# Patient Record
Sex: Male | Born: 1970 | Race: White | Hispanic: No | Marital: Married | Smoking: Never smoker
Health system: Southern US, Community
[De-identification: ages and names within clinical notes are randomized; demographics above are authoritative.]

## PROBLEM LIST (undated history)

## (undated) DIAGNOSIS — I10 Essential (primary) hypertension: Secondary | ICD-10-CM

## (undated) HISTORY — PX: KNEE SURGERY: SHX244

## (undated) HISTORY — PX: SHOULDER SURGERY: SHX246

---

## 2017-10-01 ENCOUNTER — Emergency Department (HOSPITAL_COMMUNITY): Payer: BLUE CROSS/BLUE SHIELD

## 2017-10-01 ENCOUNTER — Other Ambulatory Visit: Payer: Self-pay

## 2017-10-01 ENCOUNTER — Emergency Department (HOSPITAL_COMMUNITY)
Admission: EM | Admit: 2017-10-01 | Discharge: 2017-10-01 | Disposition: A | Discharge status: Home / Self Care | Payer: BLUE CROSS/BLUE SHIELD | Attending: Emergency Medicine | Admitting: Emergency Medicine

## 2017-10-01 ENCOUNTER — Encounter (HOSPITAL_COMMUNITY): Payer: Self-pay

## 2017-10-01 DIAGNOSIS — N132 Hydronephrosis with renal and ureteral calculous obstruction: Secondary | ICD-10-CM | POA: Insufficient documentation

## 2017-10-01 DIAGNOSIS — I10 Essential (primary) hypertension: Secondary | ICD-10-CM | POA: Diagnosis not present

## 2017-10-01 DIAGNOSIS — R109 Unspecified abdominal pain: Secondary | ICD-10-CM | POA: Diagnosis present

## 2017-10-01 DIAGNOSIS — N200 Calculus of kidney: Secondary | ICD-10-CM

## 2017-10-01 HISTORY — DX: Essential (primary) hypertension: I10

## 2017-10-01 LAB — URINALYSIS, ROUTINE W REFLEX MICROSCOPIC
Bacteria, UA: NONE SEEN
Bilirubin Urine: NEGATIVE
Glucose, UA: NEGATIVE mg/dL
Ketones, ur: 20 mg/dL — AB
Nitrite: NEGATIVE
PH: 6 (ref 5.0–8.0)
Protein, ur: NEGATIVE mg/dL
Specific Gravity, Urine: 1.018 (ref 1.005–1.030)

## 2017-10-01 LAB — CBC WITH DIFFERENTIAL/PLATELET
BASOS ABS: 0 10*3/uL (ref 0.0–0.1)
BASOS PCT: 0 %
Eosinophils Absolute: 0 10*3/uL (ref 0.0–0.7)
Eosinophils Relative: 0 %
HCT: 43.8 % (ref 39.0–52.0)
Hemoglobin: 15.1 g/dL (ref 13.0–17.0)
LYMPHS PCT: 6 %
Lymphs Abs: 0.9 10*3/uL (ref 0.7–4.0)
MCH: 30.7 pg (ref 26.0–34.0)
MCHC: 34.5 g/dL (ref 30.0–36.0)
MCV: 89 fL (ref 78.0–100.0)
MONOS PCT: 2 %
Monocytes Absolute: 0.3 10*3/uL (ref 0.1–1.0)
NEUTROS ABS: 14.2 10*3/uL — AB (ref 1.7–7.7)
NEUTROS PCT: 92 %
PLATELETS: 264 10*3/uL (ref 150–400)
RBC: 4.92 MIL/uL (ref 4.22–5.81)
RDW: 12.8 % (ref 11.5–15.5)
WBC: 15.4 10*3/uL — ABNORMAL HIGH (ref 4.0–10.5)

## 2017-10-01 LAB — COMPREHENSIVE METABOLIC PANEL
ALBUMIN: 4.1 g/dL (ref 3.5–5.0)
ALT: 36 U/L (ref 0–44)
AST: 29 U/L (ref 15–41)
Alkaline Phosphatase: 62 U/L (ref 38–126)
Anion gap: 14 (ref 5–15)
BUN: 16 mg/dL (ref 6–20)
CHLORIDE: 104 mmol/L (ref 98–111)
CO2: 22 mmol/L (ref 22–32)
Calcium: 9.4 mg/dL (ref 8.9–10.3)
Creatinine, Ser: 1.38 mg/dL — ABNORMAL HIGH (ref 0.61–1.24)
GFR calc Af Amer: >60 mL/min (ref >60)
GFR calc non Af Amer: 60 mL/min — ABNORMAL LOW (ref >60)
GLUCOSE: 135 mg/dL — AB (ref 70–99)
POTASSIUM: 3.8 mmol/L (ref 3.5–5.1)
SODIUM: 140 mmol/L (ref 135–145)
Total Bilirubin: 0.9 mg/dL (ref 0.3–1.2)
Total Protein: 7.4 g/dL (ref 6.5–8.1)

## 2017-10-01 MED ORDER — TAMSULOSIN HCL 0.4 MG PO CAPS: 0.4000 mg | ORAL_CAPSULE | Freq: Every day | ORAL | 0 refills | Status: AC

## 2017-10-01 MED ORDER — HYDROCODONE-ACETAMINOPHEN 5-325 MG PO TABS: 2.0000 | ORAL_TABLET | Freq: Four times a day (QID) | ORAL | 0 refills | Status: AC | PRN

## 2017-10-01 MED ORDER — HYDROMORPHONE HCL 1 MG/ML IJ SOLN
1.0000 mg | Freq: Once | INTRAMUSCULAR | Status: AC
  Administered 2017-10-01: 1 mg via INTRAVENOUS
  Filled 2017-10-01: qty 1

## 2017-10-01 MED ORDER — ONDANSETRON HCL 4 MG/2ML IJ SOLN
4.0000 mg | Freq: Once | INTRAMUSCULAR | Status: AC
  Administered 2017-10-01: 4 mg via INTRAVENOUS
  Filled 2017-10-01: qty 2

## 2017-10-01 MED ORDER — MORPHINE SULFATE (PF) 4 MG/ML IV SOLN
4.0000 mg | Freq: Once | INTRAVENOUS | Status: AC
  Administered 2017-10-01: 4 mg via INTRAVENOUS
  Filled 2017-10-01: qty 1

## 2017-10-01 MED ORDER — SODIUM CHLORIDE 0.9 % IV BOLUS
1000.0000 mL | Freq: Once | INTRAVENOUS | Status: AC
  Administered 2017-10-01: 1000 mL via INTRAVENOUS

## 2017-10-01 MED ORDER — KETOROLAC TROMETHAMINE 15 MG/ML IJ SOLN
15.0000 mg | Freq: Once | INTRAMUSCULAR | Status: AC
  Administered 2017-10-01: 15 mg via INTRAVENOUS
  Filled 2017-10-01: qty 1

## 2017-10-01 NOTE — ED Triage Notes (Signed)
Pt states approx 1 hour ago, he started having left sided flank pain going to his groin. Pt states he has never had pain "this bad before".

## 2017-10-01 NOTE — ED Provider Notes (Signed)
[facility]-EMERGENCY DEPT Provider Note   CSN: [id number] Arrival date & time: 10/01/17  1506   History   Chief Complaint Chief Complaint  Patient presents with  . Flank Pain    HPI James Keith is a 47 y.o. male with a past medical history significant for hypertension who presents for evaluation of left-sided flank pain.  Per patient this pain started approximately 1 PM and was sudden in onset.  His pain radiates to his left groin.  Rates his pain a 10/10.  Denies history of kidney stones, fever, chills, dysuria, hematuria, diarrhea, constipation.     Past Medical History:  Diagnosis Date  . Hypertension     There are no active problems to display for this patient.   Past Surgical History:  Procedure Laterality Date  . KNEE SURGERY    . SHOULDER SURGERY          Home Medications    Prior to Admission medications   Medication Sig Start Date End Date Taking? Authorizing Provider  HYDROcodone-acetaminophen (NORCO/VICODIN) 5-325 MG tablet Take 2 tablets by mouth every 6 (six) hours as needed for up to 3 days. 10/01/17 10/04/17  Kae Lauman A, PA-C  tamsulosin (FLOMAX) 0.4 MG CAPS capsule Take 1 capsule (0.4 mg total) by mouth daily for 7 days. 10/01/17 10/08/17  Jamillia Closson A, PA-C    Family History No family history on file.  Social History Social History   Tobacco Use  . Smoking status: Never Smoker  . Smokeless tobacco: Never Used  Substance Use Topics  . Alcohol use: Yes    Comment: occ  . Drug use: Never     Allergies   Patient has no known allergies.   Review of Systems Review of Systems  Constitutional: Negative for activity change, appetite change, chills, diaphoresis, fatigue, fever and unexpected weight change.  Respiratory: Negative.   Cardiovascular: Negative.   Gastrointestinal: Positive for abdominal pain and nausea. Negative for abdominal distention, blood in stool, constipation, diarrhea and vomiting.    Musculoskeletal: Positive for back pain. Negative for neck pain and neck stiffness.  Skin: Negative.      Physical Exam Updated Vital Signs BP (!) 154/114 (BP Location: Left Arm)   Pulse (!) 105   Temp 98.3 F (36.8 C) (Oral)   Resp 18   Ht 6\' 5"  (1.956 m)   Wt 127 kg   SpO2 98%   BMI 33.20 kg/m   Physical Exam  Constitutional: He appears well-developed and well-nourished. No distress.  HENT:  Head: Atraumatic.  Eyes: Pupils are equal, round, and reactive to light.  Neck: Normal range of motion. Neck supple.  Cardiovascular: Normal rate, regular rhythm, normal heart sounds and intact distal pulses.  Pulmonary/Chest: Effort normal and breath sounds normal. No respiratory distress.  Abdominal: Soft. Normal appearance and bowel sounds are normal. He exhibits no distension, no fluid wave, no abdominal bruit, no ascites, no pulsatile midline mass and no mass. There is no hepatosplenomegaly. There is no tenderness. There is CVA tenderness. There is no rigidity, no rebound, no guarding, no tenderness at McBurney's point and negative Murphy's sign.  Musculoskeletal: Normal range of motion.       Left hip: Normal.       Lumbar back: Normal.  Neurological: He is alert.  Skin: Skin is warm and dry. He is not diaphoretic.  Psychiatric: He has a normal mood and affect.  Nursing note and vitals reviewed.    ED Treatments / Results  Labs (  all labs ordered are listed, but only abnormal results are displayed) Labs Reviewed  URINALYSIS, ROUTINE W REFLEX MICROSCOPIC - Abnormal; Notable for the following components:      Result Value   Hgb urine dipstick MODERATE (*)    Ketones, ur 20 (*)    Leukocytes, UA TRACE (*)    All other components within normal limits  CBC WITH DIFFERENTIAL/PLATELET - Abnormal; Notable for the following components:   WBC 15.4 (*)    Neutro Abs 14.2 (*)    All other components within normal limits  COMPREHENSIVE METABOLIC PANEL - Abnormal; Notable for the  following components:   Glucose, Bld 135 (*)    Creatinine, Ser 1.38 (*)    GFR calc non Af Amer 60 (*)    All other components within normal limits    EKG None  Radiology Ct Abdomen Pelvis Wo Contrast  Result Date: 10/01/2017 CLINICAL DATA:  Left-sided flank pain EXAM: CT ABDOMEN AND PELVIS WITHOUT CONTRAST TECHNIQUE: Multidetector CT imaging of the abdomen and pelvis was performed following the standard protocol without IV contrast. COMPARISON:  None. FINDINGS: Lower chest: No acute abnormality. Hepatobiliary: No focal liver abnormality is seen. No gallstones, gallbladder wall thickening, or biliary dilatation. Pancreas: Unremarkable. No pancreatic ductal dilatation or surrounding inflammatory changes. Spleen: Normal in size without focal abnormality. Adrenals/Urinary Tract: Normal bilateral adrenal glands. Mild to moderate left-sided hydroureteronephrosis secondary to a 5 mm calculus at the level of the left UPJ. Nonobstructing calculus in the lower pole the left kidney is also noted measuring approximately 5-6 mm as well. The right kidney is unremarkable. No additional ureteral calculi. The urinary bladder is decompressed. Stomach/Bowel: Stomach is within normal limits. Appendix appears normal. No evidence of bowel wall thickening, distention, or inflammatory changes. Vascular/Lymphatic: No significant vascular findings are present. No enlarged abdominal or pelvic lymph nodes. Reproductive: Prostate and seminal vesicles are unremarkable. Other: Small fat containing inguinal hernias. No free air nor free fluid. Musculoskeletal: Lumbosacral transitional vertebral anatomy with S1-S2 disc. Minimal anterolisthesis L5 on S1, grade 1 with disc space narrowing. IMPRESSION: 1. 5 mm left UPJ stone causing mild-to-moderate hydroureteronephrosis. 2. Additional nonobstructing lower pole calculus the left kidney measuring 5-6 mm. 3. 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 Electronically Signed   By: Tollie Eth M.D.   On: 10/01/2017 18:35    Procedures Procedures (including critical care time)  Medications Ordered in ED Medications  sodium chloride 0.9 % bolus 1,000 mL (0 mLs Intravenous Stopped 10/01/17 2136)  ondansetron (ZOFRAN) injection 4 mg (4 mg Intravenous Given 10/01/17 1830)  morphine 4 MG/ML injection 4 mg (4 mg Intravenous Given 10/01/17 1830)  HYDROmorphone (DILAUDID) injection 1 mg (1 mg Intravenous Given 10/01/17 1900)  ketorolac (TORADOL) 15 MG/ML injection 15 mg (15 mg Intravenous Given 10/01/17 2049)  HYDROmorphone (DILAUDID) injection 1 mg (1 mg Intravenous Given 10/01/17 2134)     Initial Impression / Assessment and Plan / ED Course  I have reviewed the triage vital signs and the nursing notes past medical history.  Pertinent labs & imaging results that were available during my care of the patient were reviewed by me and considered in my medical decision making (see chart for details).  47 year old otherwise healthy male presents for evaluation of sudden onset left flank pain.  History of kidney stones.  Positive CVA tenderness. Will obtain labs, urine, CT scan fluids and pain control reevaluate.  CT scan with 5 mm stone at  Left  UPJ with mild- moderate left sided hydroureteronephrosis. 5 mm nonobstructing calculus at lower pole in left kidney.   Consulted with MacDiarmid with Urology for treatment recommendations.   Reevaluation patient is pain has improved. Patient states he would like to be dc home at this time. Will DC home with pain medication and Flomax have him follow-up with urology.  He is mildly hypertensive in the emergency department.  Currently asymptomatic.  Discussed with him follow-up with primary care provider for reevaluation of his high blood pressure.  Strict return precautions given.  Patient voiced understanding.    Final Clinical Impressions(s) / ED Diagnoses   Final diagnoses:    Nephrolithiasis    ED Discharge Orders         Ordered    HYDROcodone-acetaminophen (NORCO/VICODIN) 5-325 MG tablet  Every 6 hours PRN     10/01/17 1933    tamsulosin (FLOMAX) 0.4 MG CAPS capsule  Daily     10/01/17 1933           Brentley Horrell A, PA-C 10/01/17 2228    Terrilee FilesButler, Michael C, MD 10/02/17 1207

## 2017-10-01 NOTE — Discharge Instructions (Addendum)
You were evaluated today for left flank pain.  Your CT scan did show that you have 2-5 mm kidney stones.  I will send you home with pain medicine, Flomax and have a follow-up outpatient with the urologist.  Please return to the ED if any of the following symptoms:  Please follow up with your outpatient provider for your elevated blood pressure in the ED.  Contact a doctor if: You have pain that gets worse or does not get better with medicine. Get help right away if: You have a fever or chills. You get very bad pain. You get new pain in your belly (abdomen). You pass out (faint). You cannot pee.

## 2017-10-01 NOTE — ED Notes (Signed)
Bladder scan volume 0mL  RN Aware PA Britni Aware

## 2019-05-10 IMAGING — CT CT ABD-PELV W/O CM
2 of 4 series · 16 of 46 positions shown, 18 images · non-contrast
Comparison: None.

CLINICAL DATA: Left-sided flank pain

EXAM:
CT ABDOMEN AND PELVIS WITHOUT CONTRAST
TECHNIQUE: Multidetector CT imaging of the abdomen and pelvis was performed
following the standard protocol without IV contrast.

[Series 2: axial st · axial · 0.93mm/px · z∈[+871,+1376]mm · 13 of 113 slices shown, 15 images]
[im 6/113  soft-tissue]
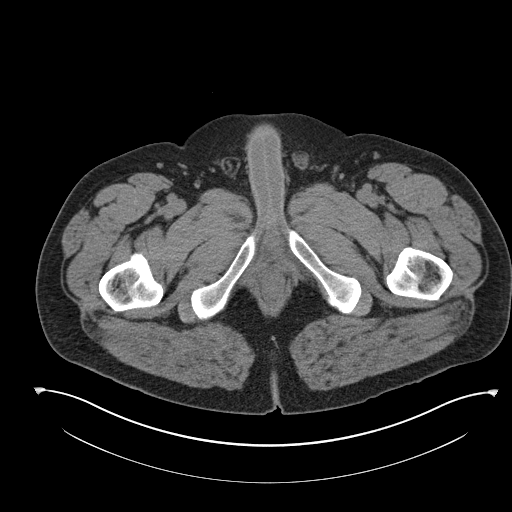
[im 6/113  bone]
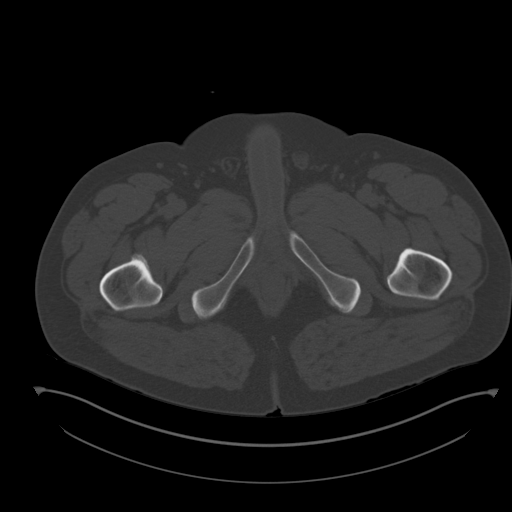
[im 18/113  soft-tissue]
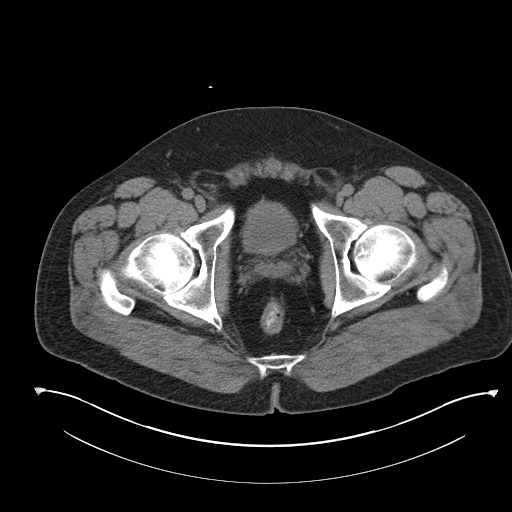
[im 24/113  soft-tissue]
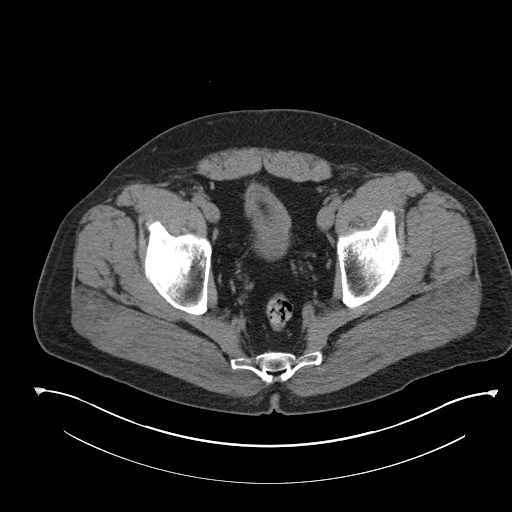
[im 30/113  soft-tissue]
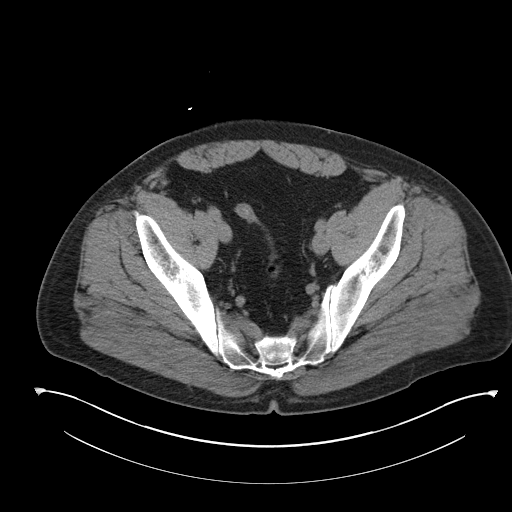
[im 42/113  soft-tissue]
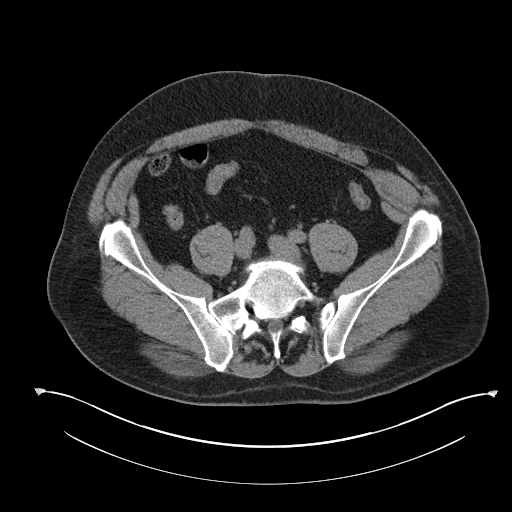
[im 48/113  soft-tissue]
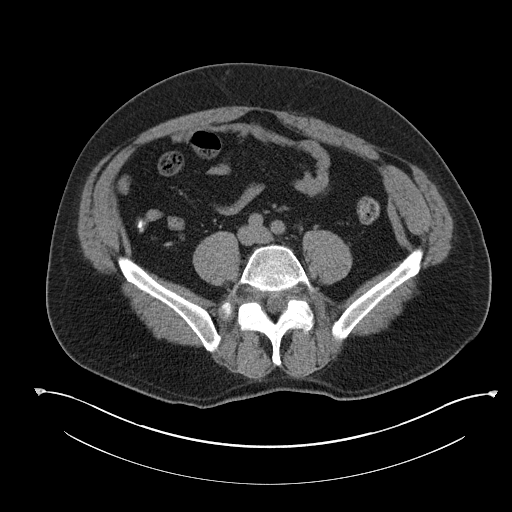
[im 59/113  soft-tissue]
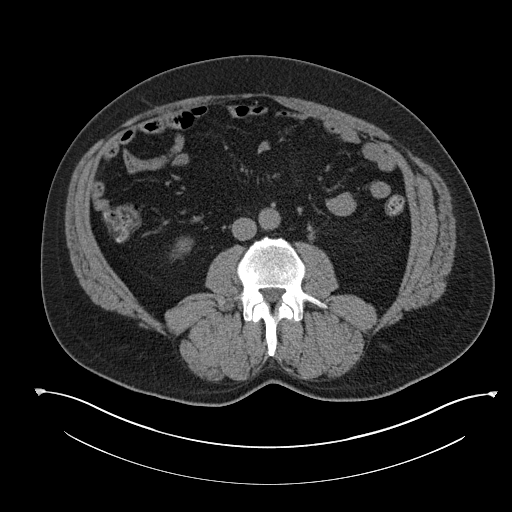
[im 65/113  soft-tissue]
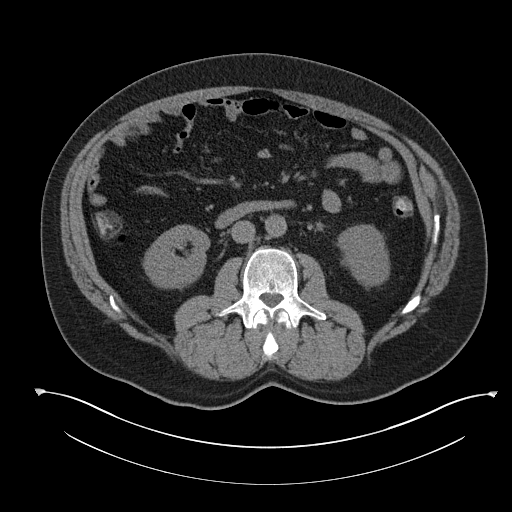
[im 71/113  soft-tissue]
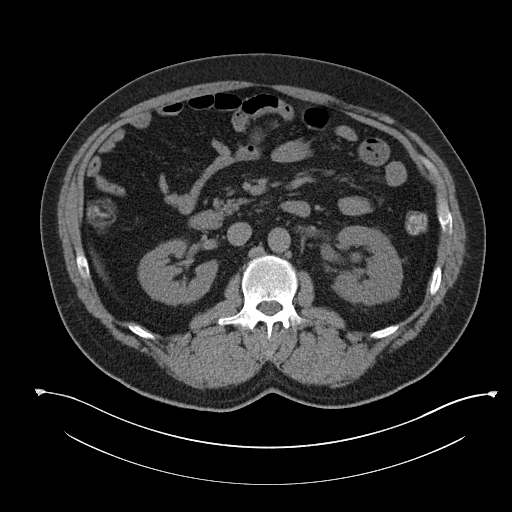
[im 71/113  bone]
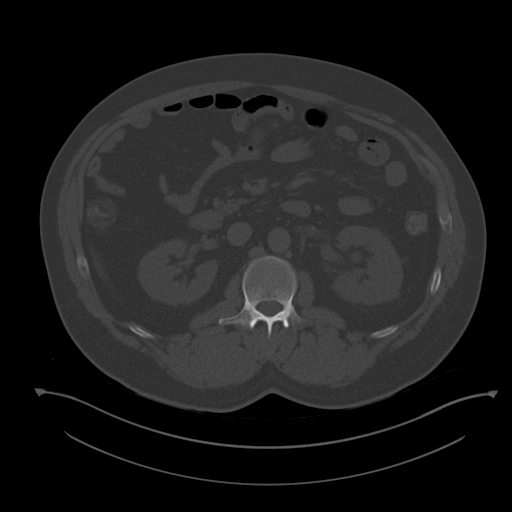
[im 83/113  soft-tissue]
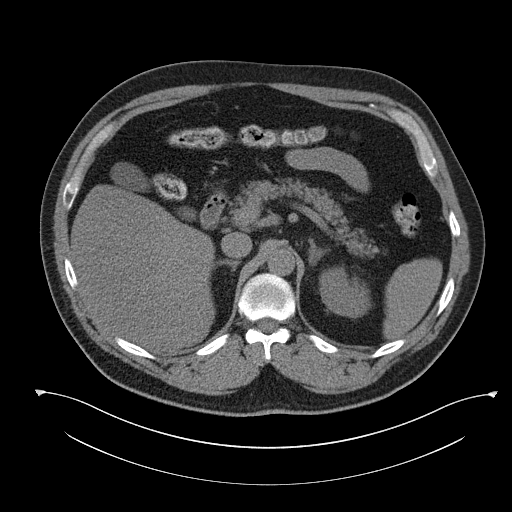
[im 89/113  soft-tissue]
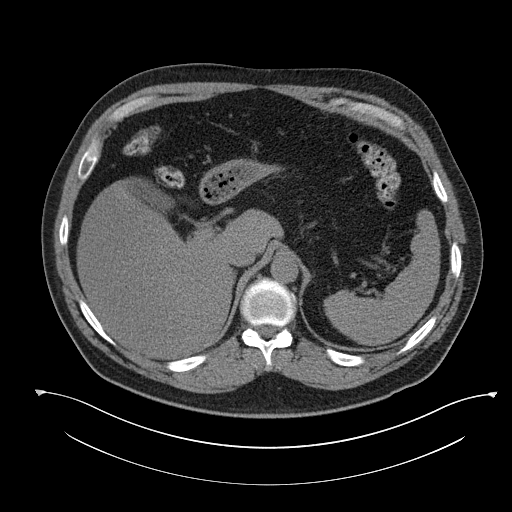
[im 95/113  soft-tissue]
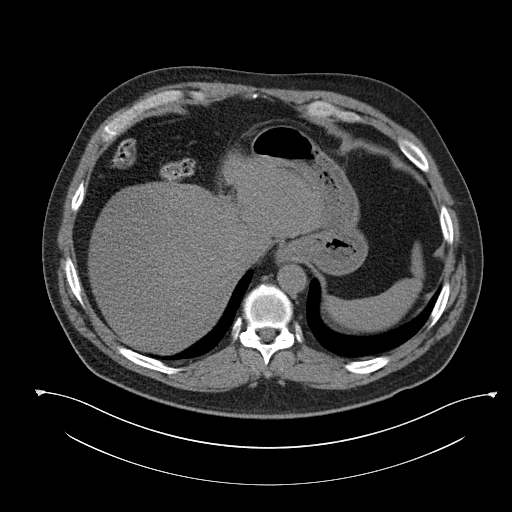
[im 107/113  soft-tissue]
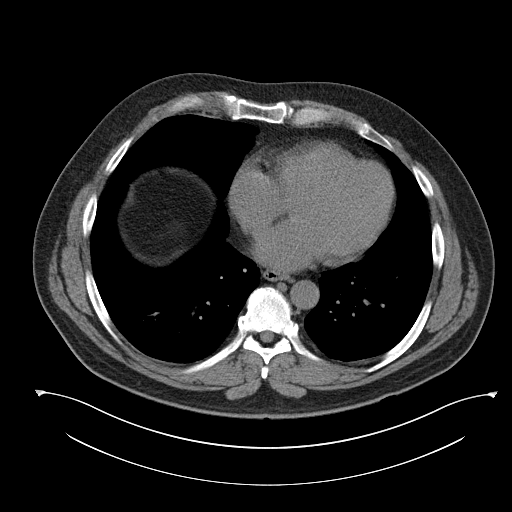

[Series 5: coronal st · coronal · 0.94mm/px · 3 of 114 slices shown]
[im 38/114  soft-tissue]
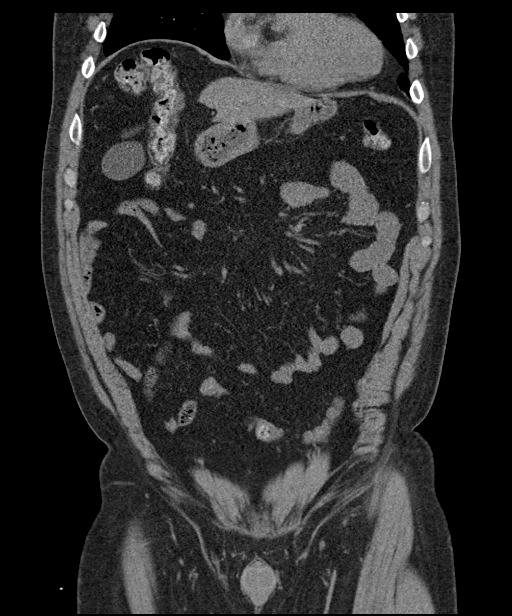
[im 51/114  soft-tissue]
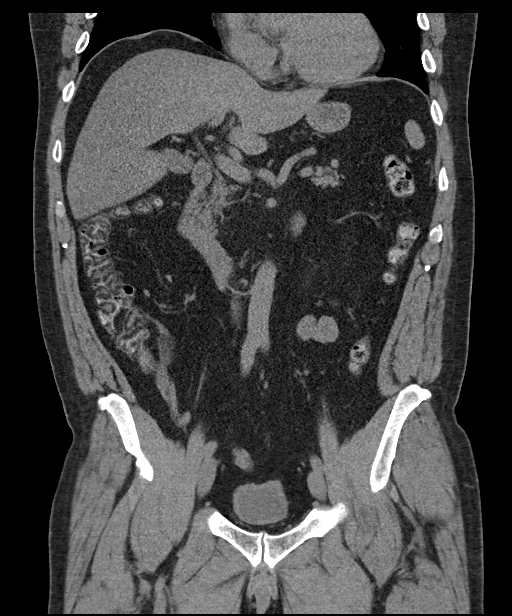
[im 63/114  soft-tissue]
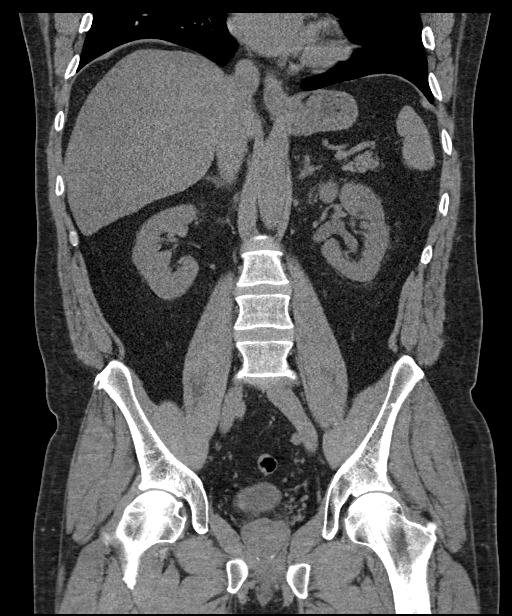

[16 of 46 positions shown; findings below may reference images not displayed]

FINDINGS: Lower chest: No acute abnormality.

Hepatobiliary: No focal liver abnormality is seen. No gallstones,
gallbladder wall thickening, or biliary dilatation.

Pancreas: Unremarkable. No pancreatic ductal dilatation or
surrounding inflammatory changes.

Spleen: Normal in size without focal abnormality.

Adrenals/Urinary Tract: Normal bilateral adrenal glands. Mild to
moderate left-sided hydroureteronephrosis secondary to a 5 mm
calculus at the level of the left UPJ. Nonobstructing calculus in
the lower pole the left kidney is also noted measuring approximately
5-6 mm as well. The right kidney is unremarkable. No additional
ureteral calculi. The urinary bladder is decompressed.

Stomach/Bowel: Stomach is within normal limits. Appendix appears
normal. No evidence of bowel wall thickening, distention, or
inflammatory changes.

Vascular/Lymphatic: No significant vascular findings are present. No
enlarged abdominal or pelvic lymph nodes.

Reproductive: Prostate and seminal vesicles are unremarkable.

Other: Small fat containing inguinal hernias. No free air nor free
fluid.

Musculoskeletal: Lumbosacral transitional vertebral anatomy with
S1-S2 disc. Minimal anterolisthesis L5 on S1, grade 1 with disc
space narrowing.
IMPRESSION: 1. 5 mm left UPJ stone causing mild-to-moderate
hydroureteronephrosis.
2. Additional nonobstructing lower pole calculus the left kidney
measuring 5-6 mm.
3. Lumbosacral transitional vertebral grade 1 anterolisthesis of
L5-S1 associated with slight disc space narrowing.
